# Patient Record
Sex: Female | Born: 1985 | Race: Black or African American | Hispanic: No | Marital: Single | Smoking: Current every day smoker
Health system: Southern US, Community
[De-identification: ages and names within clinical notes are randomized; demographics above are authoritative.]

---

## 2010-03-06 ENCOUNTER — Ambulatory Visit: Payer: Self-pay | Admitting: Internal Medicine

## 2010-03-06 ENCOUNTER — Emergency Department: Payer: Self-pay | Admitting: Emergency Medicine

## 2012-01-14 ENCOUNTER — Emergency Department: Payer: Self-pay | Admitting: Emergency Medicine

## 2012-07-06 ENCOUNTER — Emergency Department: Payer: Self-pay | Admitting: Internal Medicine

## 2012-07-06 LAB — CBC
HCT: 34.6 % — ABNORMAL LOW (ref 35.0–47.0)
MCHC: 32.4 g/dL (ref 32.0–36.0)
MCV: 76 fL — ABNORMAL LOW (ref 80–100)
Platelet: 379 10*3/uL (ref 150–440)
RDW: 17.5 % — ABNORMAL HIGH (ref 11.5–14.5)
WBC: 10.4 10*3/uL (ref 3.6–11.0)

## 2012-07-06 LAB — COMPREHENSIVE METABOLIC PANEL
Albumin: 3.5 g/dL (ref 3.4–5.0)
Anion Gap: 3 — ABNORMAL LOW (ref 7–16)
BUN: 5 mg/dL — ABNORMAL LOW (ref 7–18)
Calcium, Total: 9.3 mg/dL (ref 8.5–10.1)
Co2: 31 mmol/L (ref 21–32)
Creatinine: 0.84 mg/dL (ref 0.60–1.30)
Glucose: 88 mg/dL (ref 65–99)
SGOT(AST): 17 U/L (ref 15–37)
Sodium: 139 mmol/L (ref 136–145)

## 2012-07-06 LAB — URINALYSIS, COMPLETE
Bacteria: NONE SEEN
Bilirubin,UR: NEGATIVE
Blood: NEGATIVE
Nitrite: NEGATIVE
Ph: 6 (ref 4.5–8.0)
Protein: NEGATIVE
RBC,UR: 2 /HPF (ref 0–5)
Specific Gravity: 1.018 (ref 1.003–1.030)
WBC UR: 1 /HPF (ref 0–5)

## 2012-08-04 ENCOUNTER — Emergency Department: Payer: Self-pay | Admitting: Emergency Medicine

## 2012-08-27 ENCOUNTER — Emergency Department: Payer: Self-pay | Admitting: Emergency Medicine

## 2012-11-11 ENCOUNTER — Emergency Department: Payer: Self-pay | Admitting: Emergency Medicine

## 2012-11-11 LAB — URINALYSIS, COMPLETE
Bacteria: NONE SEEN
Bilirubin,UR: NEGATIVE
Glucose,UR: NEGATIVE mg/dL (ref 0–75)
Ketone: NEGATIVE
Leukocyte Esterase: NEGATIVE
Nitrite: NEGATIVE
Ph: 8 (ref 4.5–8.0)
Protein: NEGATIVE
RBC,UR: 1 /HPF (ref 0–5)
Specific Gravity: 1.012 (ref 1.003–1.030)
WBC UR: <1 /HPF (ref 0–5)

## 2012-11-11 LAB — BASIC METABOLIC PANEL
Anion Gap: 4 — ABNORMAL LOW (ref 7–16)
BUN: 6 mg/dL — ABNORMAL LOW (ref 7–18)
Calcium, Total: 9.2 mg/dL (ref 8.5–10.1)
Co2: 30 mmol/L (ref 21–32)
Creatinine: 0.79 mg/dL (ref 0.60–1.30)
EGFR (Non-African Amer.): >60
Potassium: 3.9 mmol/L (ref 3.5–5.1)

## 2012-11-11 LAB — CBC
HGB: 11.1 g/dL — ABNORMAL LOW (ref 12.0–16.0)
MCH: 25.7 pg — ABNORMAL LOW (ref 26.0–34.0)
MCHC: 33.5 g/dL (ref 32.0–36.0)
Platelet: 339 10*3/uL (ref 150–440)
RBC: 4.32 10*6/uL (ref 3.80–5.20)
RDW: 16.6 % — ABNORMAL HIGH (ref 11.5–14.5)
WBC: 10.7 10*3/uL (ref 3.6–11.0)

## 2013-03-08 ENCOUNTER — Emergency Department: Payer: Self-pay | Admitting: Emergency Medicine

## 2013-08-20 ENCOUNTER — Emergency Department: Payer: Self-pay | Admitting: Emergency Medicine

## 2015-05-22 ENCOUNTER — Emergency Department
Admission: EM | Admit: 2015-05-22 | Discharge: 2015-05-22 | Disposition: A | Discharge status: Home / Self Care | Payer: Self-pay | Attending: Emergency Medicine | Admitting: Emergency Medicine

## 2015-05-22 DIAGNOSIS — K529 Noninfective gastroenteritis and colitis, unspecified: Secondary | ICD-10-CM | POA: Insufficient documentation

## 2015-05-22 DIAGNOSIS — R197 Diarrhea, unspecified: Secondary | ICD-10-CM

## 2015-05-22 DIAGNOSIS — R112 Nausea with vomiting, unspecified: Secondary | ICD-10-CM

## 2015-05-22 LAB — COMPREHENSIVE METABOLIC PANEL
ALK PHOS: 74 U/L (ref 38–126)
ALT: 15 U/L (ref 14–54)
ANION GAP: 8 (ref 5–15)
AST: 20 U/L (ref 15–41)
Albumin: 4 g/dL (ref 3.5–5.0)
BILIRUBIN TOTAL: 0.5 mg/dL (ref 0.3–1.2)
BUN: 9 mg/dL (ref 6–20)
CALCIUM: 9.3 mg/dL (ref 8.9–10.3)
CO2: 29 mmol/L (ref 22–32)
CREATININE: 1.06 mg/dL — AB (ref 0.44–1.00)
Chloride: 99 mmol/L — ABNORMAL LOW (ref 101–111)
Glucose, Bld: 114 mg/dL — ABNORMAL HIGH (ref 65–99)
Potassium: 3.4 mmol/L — ABNORMAL LOW (ref 3.5–5.1)
Sodium: 136 mmol/L (ref 135–145)
TOTAL PROTEIN: 7.8 g/dL (ref 6.5–8.1)

## 2015-05-22 LAB — URINALYSIS COMPLETE WITH MICROSCOPIC (ARMC ONLY)
Bacteria, UA: NONE SEEN
Bilirubin Urine: NEGATIVE
GLUCOSE, UA: NEGATIVE mg/dL
Hgb urine dipstick: NEGATIVE
Ketones, ur: NEGATIVE mg/dL
Leukocytes, UA: NEGATIVE
Nitrite: NEGATIVE
Protein, ur: NEGATIVE mg/dL
RBC / HPF: NONE SEEN RBC/hpf (ref 0–5)
SPECIFIC GRAVITY, URINE: 1 — AB (ref 1.005–1.030)
pH: 6 (ref 5.0–8.0)

## 2015-05-22 LAB — CBC
HEMATOCRIT: 36.9 % (ref 35.0–47.0)
HEMOGLOBIN: 12.3 g/dL (ref 12.0–16.0)
MCH: 25.9 pg — AB (ref 26.0–34.0)
MCHC: 33.3 g/dL (ref 32.0–36.0)
MCV: 77.9 fL — AB (ref 80.0–100.0)
Platelets: 388 10*3/uL (ref 150–440)
RBC: 4.73 MIL/uL (ref 3.80–5.20)
RDW: 15.2 % — ABNORMAL HIGH (ref 11.5–14.5)
WBC: 17 10*3/uL — ABNORMAL HIGH (ref 3.6–11.0)

## 2015-05-22 LAB — LIPASE, BLOOD: LIPASE: 17 U/L (ref 11–51)

## 2015-05-22 MED ORDER — LOPERAMIDE HCL 2 MG PO CAPS
4.0000 mg | ORAL_CAPSULE | Freq: Once | ORAL | Status: AC
  Administered 2015-05-22: 4 mg via ORAL

## 2015-05-22 MED ORDER — SODIUM CHLORIDE 0.9 % IV BOLUS (SEPSIS)
1000.0000 mL | Freq: Once | INTRAVENOUS | Status: AC
  Administered 2015-05-22: 1000 mL via INTRAVENOUS

## 2015-05-22 MED ORDER — ONDANSETRON 4 MG PO TBDP: 4.0000 mg | ORAL_TABLET | Freq: Three times a day (TID) | ORAL | Status: DC | PRN

## 2015-05-22 MED ORDER — ONDANSETRON HCL 4 MG/2ML IJ SOLN
INTRAMUSCULAR | Status: AC
  Administered 2015-05-22: 4 mg via INTRAVENOUS
  Filled 2015-05-22: qty 2

## 2015-05-22 MED ORDER — ONDANSETRON HCL 4 MG/2ML IJ SOLN
4.0000 mg | Freq: Once | INTRAMUSCULAR | Status: AC
  Administered 2015-05-22: 4 mg via INTRAVENOUS

## 2015-05-22 MED ORDER — LOPERAMIDE HCL 2 MG PO CAPS
ORAL_CAPSULE | ORAL | Status: AC
  Administered 2015-05-22: 4 mg via ORAL
  Filled 2015-05-22: qty 2

## 2015-05-22 NOTE — ED Provider Notes (Signed)
[facility] Emergency Department Provider Note  Time seen: 9:39 PM  I have reviewed the triage vital signs and the nursing notes.   HISTORY  Chief Complaint Emesis    HPI Lauren Dean is a 30 y.o. female with no past medical history who presents the emergency department nausea, vomiting, diarrhea beginning yesterday. According to the patient for the past 24 hours or so she has been nauseated with intermittent vomiting and also having a moderate amount of loose stool. Denies any abdominal pain. Denies any chest pain. Denies any dysuria. Denies any known sick contacts. Patient states she feels dehydrated which is why she came to the emergency department.     No past medical history on file.  There are no active problems to display for this patient.   No past surgical history on file.  No current outpatient prescriptions on file.  Allergies Review of patient's allergies indicates no known allergies.  No family history on file.  Social History Social History  Substance Use Topics  . Smoking status: Not on file  . Smokeless tobacco: Not on file  . Alcohol Use: Not on file    Review of Systems Constitutional: Negative for fever. Cardiovascular: Negative for chest pain. Respiratory: Negative for shortness of breath. Gastrointestinal: Negative for abdominal pain. Positive for nausea, vomiting, diarrhea. Genitourinary: Negative for dysuria. Musculoskeletal: Negative for back pain Neurological: Negative for headache 10-point ROS otherwise negative.  ____________________________________________   PHYSICAL EXAM:  VITAL SIGNS: ED Triage Vitals  Enc Vitals Group     BP 05/22/15 2038 145/79 mmHg     Pulse Rate 05/22/15 2039 82     Resp 05/22/15 2039 18     Temp 05/22/15 2039 98.2 F (36.8 C)     Temp Source 05/22/15 2034 Oral     SpO2 05/22/15 2039 95 %     Weight 05/22/15 2034 255 lb (115.667 kg)     Height 05/22/15 2034  (1.702 m)     Head Cir --      Peak Flow --      Pain Score 05/22/15 2129 0     Pain Loc --      Pain Edu? --      Excl. in GC? --     Constitutional: Alert and oriented. Well appearing and in no distress. Eyes: Normal exam ENT   Head: Normocephalic and atraumatic.   Mouth/Throat: Mucous membranes are moist. Cardiovascular: Normal rate, regular rhythm. No murmur Respiratory: Normal respiratory effort without tachypnea nor retractions. Breath sounds are clear  Gastrointestinal: Soft and nontender. No distention.  Musculoskeletal: Nontender with normal range of motion in all extremities. Neurologic:  Normal speech and language. No gross focal neurologic deficits Skin:  Skin is warm, dry and intact.  Psychiatric: Mood and affect are normal.   ____________________________________________   INITIAL IMPRESSION / ASSESSMENT AND PLAN / ED COURSE  Pertinent labs & imaging results that were available during my care of the patient were reviewed by me and considered in my medical decision making (see chart for details).  Well-appearing patient, no distress. Patient states nausea, vomiting, diarrhea since last night. Denies any abdominal pain. Nontender exam. We'll dose IV Zofran, IV fluids, loperamide, and closely monitor in the emergency department.  Currently awaiting lab results.  Labs show a moderate leukocytosis otherwise within normal limits. Nontender abdomen. Likely gastroenteritis. We'll discharge Zofran. Patient agreeable plan and is feeling much better.   ____________________________________________   FINAL CLINICAL IMPRESSION(S) / ED DIAGNOSES  Gastroenteritis   Minna AntisKevin Ashad Fawbush, MD 05/22/15 2223

## 2015-05-22 NOTE — Discharge Instructions (Signed)

## 2015-05-22 NOTE — ED Notes (Signed)
Pt in with co n.v.d since yesterday denies any abd pain or dysuria.

## 2015-05-22 NOTE — ED Notes (Signed)
Accidentally clicked off POC and sent to lab before performing test

## 2016-02-13 ENCOUNTER — Emergency Department: Payer: Self-pay

## 2016-02-13 ENCOUNTER — Encounter: Payer: Self-pay | Admitting: Emergency Medicine

## 2016-02-13 ENCOUNTER — Emergency Department
Admission: EM | Admit: 2016-02-13 | Discharge: 2016-02-13 | Disposition: A | Discharge status: Home / Self Care | Payer: Self-pay | Attending: Emergency Medicine | Admitting: Emergency Medicine

## 2016-02-13 DIAGNOSIS — W010XXA Fall on same level from slipping, tripping and stumbling without subsequent striking against object, initial encounter: Secondary | ICD-10-CM | POA: Insufficient documentation

## 2016-02-13 DIAGNOSIS — Y939 Activity, unspecified: Secondary | ICD-10-CM | POA: Insufficient documentation

## 2016-02-13 DIAGNOSIS — F17200 Nicotine dependence, unspecified, uncomplicated: Secondary | ICD-10-CM | POA: Insufficient documentation

## 2016-02-13 DIAGNOSIS — Y929 Unspecified place or not applicable: Secondary | ICD-10-CM | POA: Insufficient documentation

## 2016-02-13 DIAGNOSIS — Y999 Unspecified external cause status: Secondary | ICD-10-CM | POA: Insufficient documentation

## 2016-02-13 DIAGNOSIS — S8265XA Nondisplaced fracture of lateral malleolus of left fibula, initial encounter for closed fracture: Secondary | ICD-10-CM | POA: Insufficient documentation

## 2016-02-13 MED ORDER — HYDROCODONE-ACETAMINOPHEN 5-325 MG PO TABS
1.0000 | ORAL_TABLET | Freq: Once | ORAL | Status: AC
  Administered 2016-02-13: 1 via ORAL
  Filled 2016-02-13: qty 1

## 2016-02-13 MED ORDER — OXYCODONE-ACETAMINOPHEN 5-325 MG PO TABS: 1.0000 | ORAL_TABLET | ORAL | 0 refills | Status: DC | PRN

## 2016-02-13 NOTE — ED Provider Notes (Signed)
[facility] Emergency Department Provider Note   ____________________________________________   First MD Initiated Contact with Patient 02/13/16 2107     (approximate)  I have reviewed the triage vital signs and the nursing notes.   HISTORY  Chief Complaint Foot Pain    HPI Lauren Dean is a 31 y.o. female who presents for evaluation of right foot pain after slipping and falling last night. She states that she twisted her ankle on the snow. Complaining of 10 over 10 pain at this time.   History reviewed. No pertinent past medical history.  There are no active problems to display for this patient.   History reviewed. No pertinent surgical history.  Prior to Admission medications   Medication Sig Start Date End Date Taking? Authorizing Provider  ondansetron (ZOFRAN ODT) 4 MG disintegrating tablet Take 1 tablet (4 mg total) by mouth every 8 (eight) hours as needed for nausea or vomiting. 05/22/15   Minna AntisKevin Paduchowski, MD  oxyCODONE-acetaminophen (ROXICET) 5-325 MG tablet Take 1-2 tablets by mouth every 4 (four) hours as needed for severe pain. 02/13/16   Evangeline Dakinharles M Quita Mcgrory, PA-C    Allergies Patient has no known allergies.  History reviewed. No pertinent family history.  Social History Social History  Substance Use Topics  . Smoking status: Current Every Day Smoker  . Smokeless tobacco: Never Used  . Alcohol use No    Review of Systems Constitutional: No fever/chills Eyes: No visual changes. ENT: No sore throat. Cardiovascular: Denies chest pain. Respiratory: Denies shortness of breath. Gastrointestinal: No abdominal pain.  No nausea, no vomiting.  No diarrhea.  No constipation. Genitourinary: Negative for dysuria. Musculoskeletal: Positive for lateral right foot\ankle pain. Skin: Negative for rash. Neurological: Negative for headaches, focal weakness or numbness.  10-point ROS otherwise  negative.  ____________________________________________   PHYSICAL EXAM: BP (!) 131/58 (BP Location: Right Arm)   Pulse 72   Temp 98.1 F (36.7 C) (Oral)   Resp 18   Ht 5\' 7"  (1.702 m)   Wt 111.1 kg   LMP 01/20/2016   SpO2 98%   BMI 38.37 kg/m   VITAL SIGNS: ED Triage Vitals  Enc Vitals Group     BP      Pulse      Resp      Temp      Temp src      SpO2      Weight      Height      Head Circumference      Peak Flow      Pain Score      Pain Loc      Pain Edu?      Excl. in GC?     Constitutional: Alert and oriented. Well appearing and in no acute distress.   Cardiovascular: Normal rate, regular rhythm. Grossly normal heart sounds.  Good peripheral circulation. Respiratory: Normal respiratory effort.  No retractions. Lungs CTAB. Musculoskeletal: 1+ edema right lower ankle. Neurologic:  Normal speech and language. No gross focal neurologic deficits are appreciated. Slightly neurovascularly intact. Skin:  Skin is warm, dry and intact. No rash noted. Psychiatric: Mood and affect are normal. Speech and behavior are normal.  ____________________________________________   LABS (all labs ordered are listed, but only abnormal results are displayed)  Labs Reviewed - No data to display ____________________________________________  EKG   ____________________________________________  RADIOLOGY  Distal fibular oblique fracture. ____________________________________________   PROCEDURES  Procedure(s) performed: None  Procedures  Critical Care performed: No  ____________________________________________   INITIAL IMPRESSION / ASSESSMENT AND PLAN / ED COURSE  Pertinent labs & imaging results that were available during my care of the patient were reviewed by me and considered in my medical decision making (see chart for details).  Acute closed oblique fracture of the distal fibula. Rx Percocet 5/325 crutches posterior splint applied. Patient follow-up with  orthopedics next week. She voices no other emergency medical complaints at this time.      ____________________________________________   FINAL CLINICAL IMPRESSION(S) / ED DIAGNOSES  Final diagnoses:  Closed nondisplaced fracture of lateral malleolus of left fibula, initial encounter      NEW MEDICATIONS STARTED DURING THIS VISIT:  New Prescriptions   OXYCODONE-ACETAMINOPHEN (ROXICET) 5-325 MG TABLET    Take 1-2 tablets by mouth every 4 (four) hours as needed for severe pain.     Note:  This document was prepared using Dragon voice recognition software and may include unintentional dictation errors.   Evangeline Dakin, PA-C 02/13/16 2205    Myrna Blazer, MD 02/14/16 [phone]

## 2016-02-13 NOTE — ED Triage Notes (Signed)
Pt states she slipped yesterday injuring right foot and anterior ankle. Ankle is swollen, cms intact to foot.

## 2016-02-13 NOTE — ED Notes (Signed)
Patient to stat registration in wheelchair.  Reports right foot pain after slip and fall last night.

## 2017-09-16 IMAGING — DX DG FOOT COMPLETE 3+V*R*
3 series · 3 of 3 positions shown · non-contrast
Comparison: None.

CLINICAL DATA: Pain after injury

EXAM:
RIGHT FOOT COMPLETE - 3+ VIEW

[foot ap]
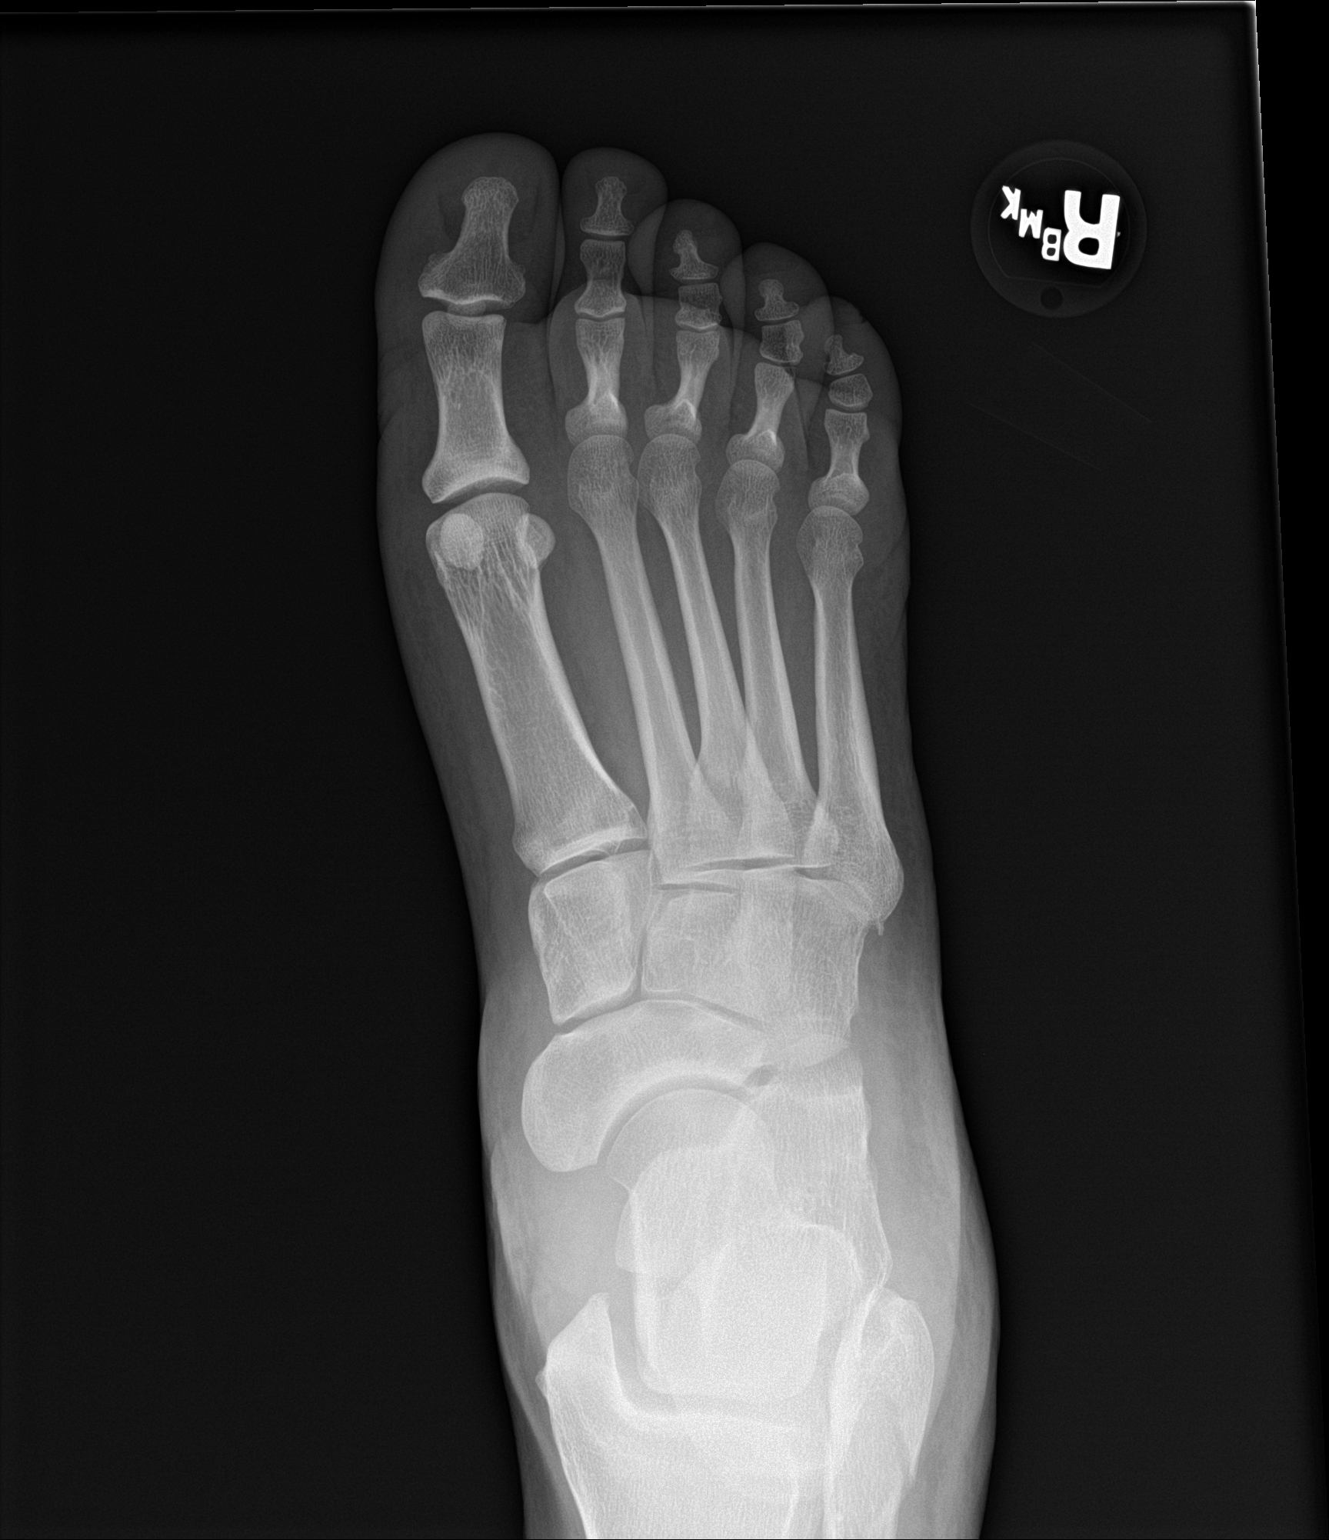

[foot obl]
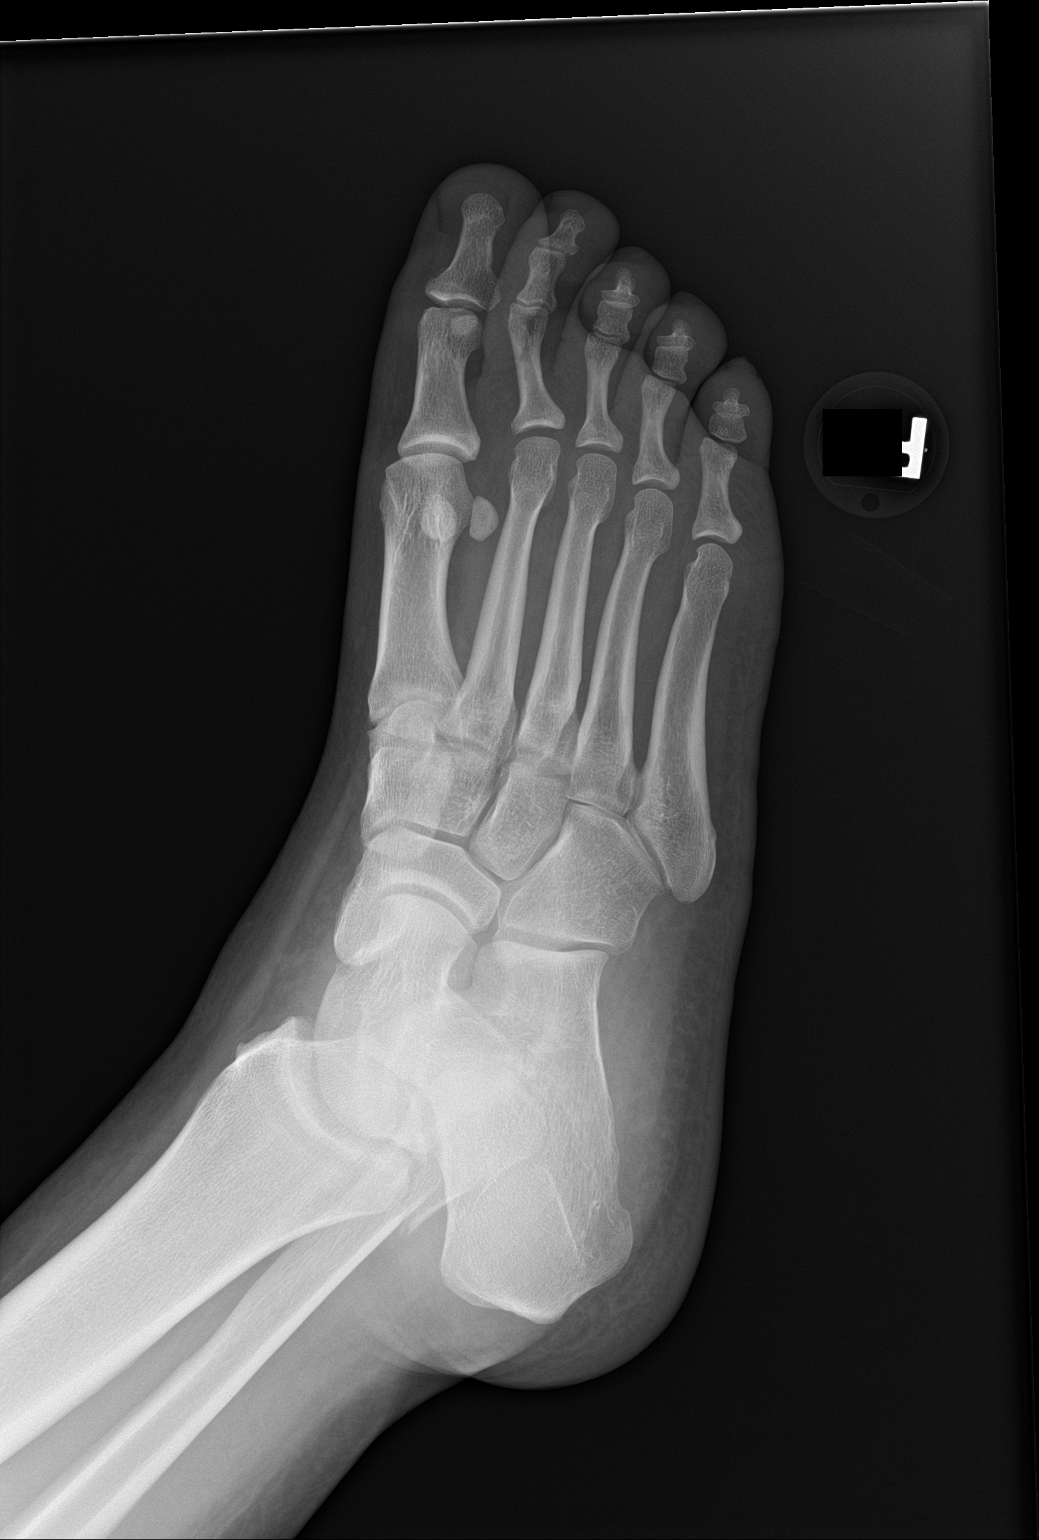

[foot lat]
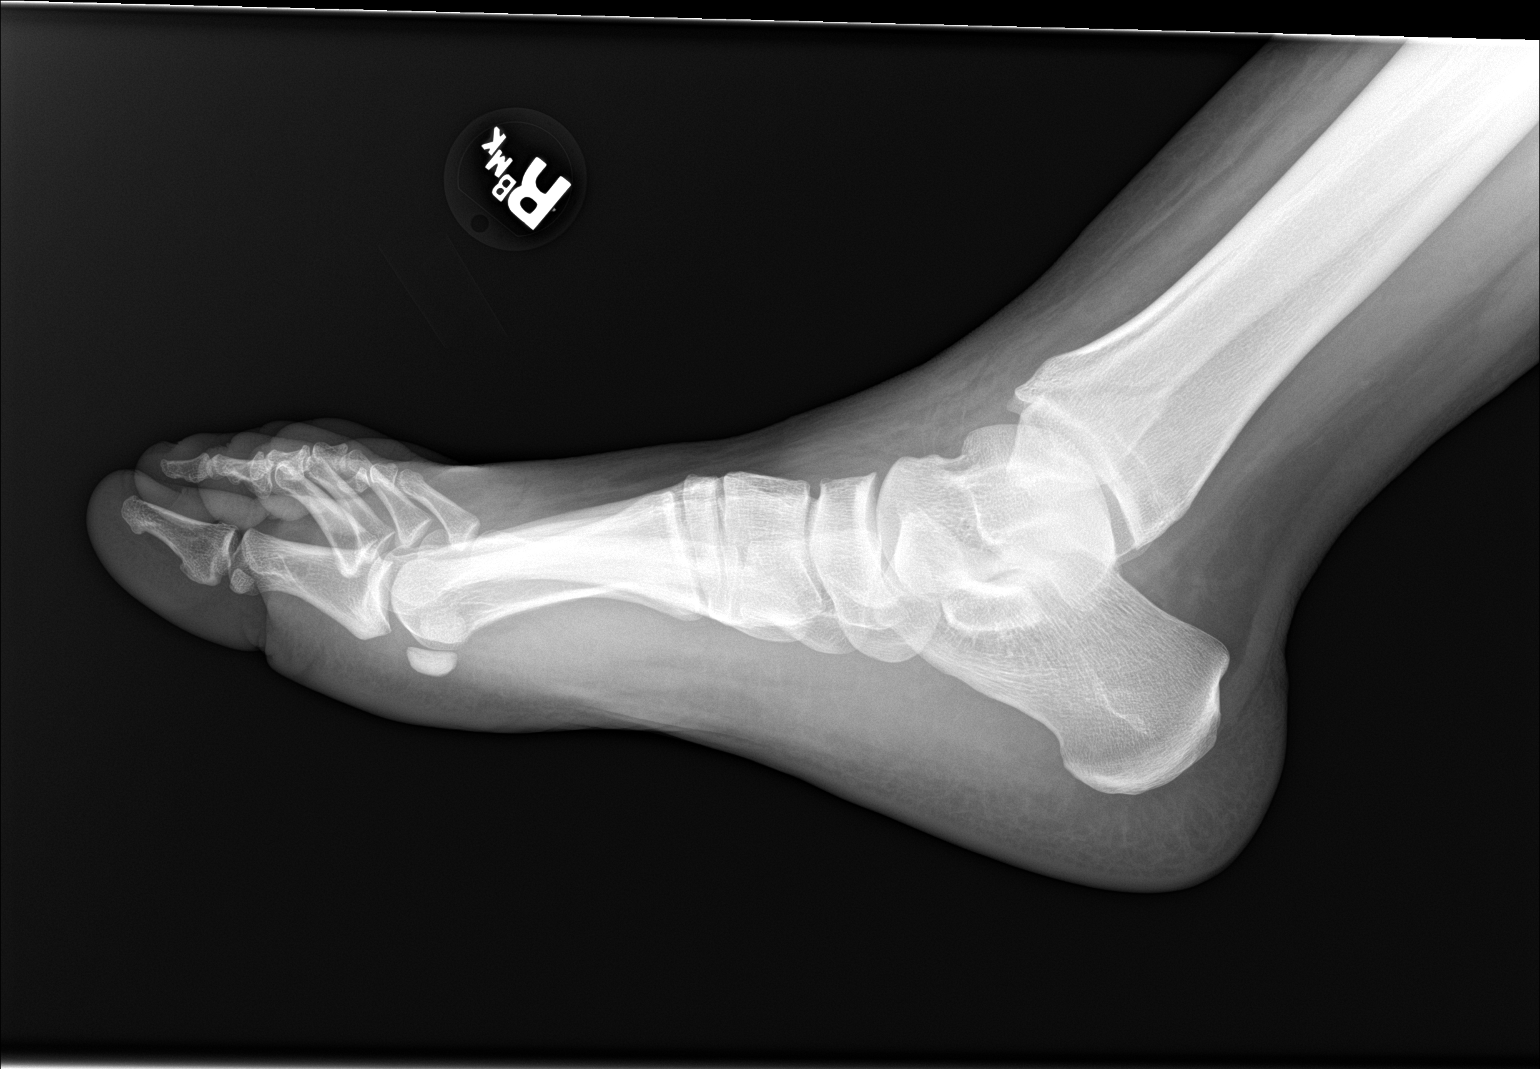

[3 of 3 positions shown; findings below may reference images not displayed]

FINDINGS: There is an oblique nondisplaced intra-articular fracture of the
distal fibula extending into the ankle joint. There is overlying
soft tissue swelling.

There is a tiny 2 x 1 mm ossification adjacent to the fifth
metatarsal base potentially representing a tiny avulsion though a
donor site is not identified. This would be along the course of the
peroneal tendons. This is not confirmed however on the oblique nor
lateral views. Correlate for pain over the base of the fifth
metatarsal and if correlated, this may be a radiographically
significant finding. Joint spaces are maintained.

Another finding is a linear zone of sclerosis involving the
posterior calcaneus potentially representing mild compression injury
of the posterior calcaneus versus a tubercle or tiny bony exostosis
off the calcaneus. This too can be correlated.
IMPRESSION: 1. Acute closed oblique fracture of the distal fibula extending into
the ankle joint.
2. Equivocal avulsion injury off the base of the metatarsal seen
only on one view with tiny ossific 2 mm density seen on the PA
projection only. Differential possibility might include tendinopathy
of a peroneal tendon.
3. Linear zone of sclerosis involving the posterior calcaneus
potentially representing a mild compression fracture of the
calcaneus

## 2018-02-22 ENCOUNTER — Emergency Department
Admission: EM | Admit: 2018-02-22 | Discharge: 2018-02-22 | Disposition: A | Discharge status: Home / Self Care | Payer: Self-pay | Attending: Emergency Medicine | Admitting: Emergency Medicine

## 2018-02-22 ENCOUNTER — Other Ambulatory Visit: Payer: Self-pay

## 2018-02-22 ENCOUNTER — Encounter: Payer: Self-pay | Admitting: Emergency Medicine

## 2018-02-22 DIAGNOSIS — F17200 Nicotine dependence, unspecified, uncomplicated: Secondary | ICD-10-CM | POA: Insufficient documentation

## 2018-02-22 DIAGNOSIS — K047 Periapical abscess without sinus: Secondary | ICD-10-CM | POA: Insufficient documentation

## 2018-02-22 MED ORDER — AMOXICILLIN 500 MG PO CAPS: 500.0000 mg | ORAL_CAPSULE | Freq: Three times a day (TID) | ORAL | 0 refills | Status: DC

## 2018-02-22 MED ORDER — LIDOCAINE VISCOUS HCL 2 % MT SOLN
15.0000 mL | Freq: Once | OROMUCOSAL | Status: AC
  Administered 2018-02-22: 15 mL via OROMUCOSAL
  Filled 2018-02-22: qty 15

## 2018-02-22 MED ORDER — LIDOCAINE VISCOUS HCL 2 % MT SOLN: 10.0000 mL | OROMUCOSAL | 0 refills | Status: DC | PRN

## 2018-02-22 MED ORDER — IBUPROFEN 600 MG PO TABS
600.0000 mg | ORAL_TABLET | Freq: Once | ORAL | Status: AC
  Administered 2018-02-22: 600 mg via ORAL
  Filled 2018-02-22: qty 1

## 2018-02-22 NOTE — ED Provider Notes (Signed)
[facility] Emergency Department Provider Note  ____________________________________________  Time seen: Approximately 8:40 AM  I have reviewed the triage vital signs and the nursing notes.   HISTORY  Chief Complaint Facial Swelling and Dental Pain    HPI Lauren Dean is a 33 y.o. female presents to emergency department for evaluation of right-sided cheek swelling and pain for 2 days.  Patient states that she has a tooth broken off.  She does not have a dentist. No fever.  History reviewed. No pertinent past medical history.  There are no active problems to display for this patient.   History reviewed. No pertinent surgical history.  Prior to Admission medications   Medication Sig Start Date End Date Taking? Authorizing Provider  amoxicillin (AMOXIL) 500 MG capsule Take 1 capsule (500 mg total) by mouth 3 (three) times daily. 02/22/18   Enid Derry, PA-C  lidocaine (XYLOCAINE) 2 % solution Use as directed 10 mLs in the mouth or throat as needed for mouth pain. 02/22/18   Enid Derry, PA-C  ondansetron (ZOFRAN ODT) 4 MG disintegrating tablet Take 1 tablet (4 mg total) by mouth every 8 (eight) hours as needed for nausea or vomiting. 05/22/15   Minna Antis, MD  oxyCODONE-acetaminophen (ROXICET) 5-325 MG tablet Take 1-2 tablets by mouth every 4 (four) hours as needed for severe pain. 02/13/16   Beers, Charmayne Sheer, PA-C    Allergies Patient has no known allergies.  No family history on file.  Social History Social History   Tobacco Use  . Smoking status: Current Every Day Smoker  . Smokeless tobacco: Never Used  Substance Use Topics  . Alcohol use: No  . Drug use: Yes    Types: Marijuana     Review of Systems  Constitutional: No fever/chills ENT: No upper respiratory complaints. Cardiovascular: No chest pain. Respiratory: No cough. No SOB. Gastrointestinal: No abdominal pain.  No nausea, no vomiting.  Musculoskeletal: Negative for  musculoskeletal pain. Skin: Negative for rash, abrasions, lacerations, ecchymosis. Neurological: Negative for headaches, numbness or tingling   ____________________________________________   PHYSICAL EXAM:  VITAL SIGNS: ED Triage Vitals  Enc Vitals Group     BP 02/22/18 0811 135/68     Pulse Rate 02/22/18 0811 77     Resp 02/22/18 0811 16     Temp 02/22/18 0811 98.4 F (36.9 C)     Temp Source 02/22/18 0811 Oral     SpO2 02/22/18 0811 98 %     Weight 02/22/18 0812 265 lb (120.2 kg)     Height 02/22/18 0812 5\' 7"  (1.702 m)     Head Circumference --      Peak Flow --      Pain Score 02/22/18 0811 9     Pain Loc --      Pain Edu? --      Excl. in GC? --      Constitutional: Alert and oriented. Well appearing and in no acute distress. Eyes: Conjunctivae are normal. PERRL. EOMI. Head: Atraumatic. ENT:      Ears:      Nose: No congestion/rhinnorhea.      Mouth/Throat: Mucous membranes are moist.  Large cavity in tooth #13.  Mild swelling to right cheek.  Full range of motion of jaw. Neck: No stridor. Cardiovascular: Normal rate, regular rhythm.  Good peripheral circulation. Respiratory: Normal respiratory effort without tachypnea or retractions. Lungs CTAB. Good air entry to the bases with no decreased or absent breath sounds. Musculoskeletal: Full range of motion  to all extremities. No gross deformities appreciated. Neurologic:  Normal speech and language. No gross focal neurologic deficits are appreciated.  Skin:  Skin is warm, dry and intact. No rash noted. Psychiatric: Mood and affect are normal. Speech and behavior are normal. Patient exhibits appropriate insight and judgement.   ____________________________________________   LABS (all labs ordered are listed, but only abnormal results are displayed)  Labs Reviewed - No data to display ____________________________________________  EKG   ____________________________________________  RADIOLOGY   No results  found.  ____________________________________________    PROCEDURES  Procedure(s) performed:    Procedures    Medications  lidocaine (XYLOCAINE) 2 % viscous mouth solution 15 mL (15 mLs Mouth/Throat Given 02/22/18 0931)  ibuprofen (ADVIL,MOTRIN) tablet 600 mg (600 mg Oral Given 02/22/18 0931)     ____________________________________________   INITIAL IMPRESSION / ASSESSMENT AND PLAN / ED COURSE  Pertinent labs & imaging results that were available during my care of the patient were reviewed by me and considered in my medical decision making (see chart for details).  Review of the [place] CSRS was performed in accordance of the [facility] prior to dispensing any controlled drugs.     Patient's diagnosis is consistent with dental abscess.  Vital signs and exam are reassuring.  Patient will be discharged home with prescriptions for amoxicillin and viscous lidocaine. Patient is to follow up with dentist as directed.  Dental resources were given.  Patient is given ED precautions to return to the ED for any worsening or new symptoms.     ____________________________________________  FINAL CLINICAL IMPRESSION(S) / ED DIAGNOSES  Final diagnoses:  Dental abscess      NEW MEDICATIONS STARTED DURING THIS VISIT:  ED Discharge Orders         Ordered    amoxicillin (AMOXIL) 500 MG capsule  3 times daily     02/22/18 0919    lidocaine (XYLOCAINE) 2 % solution  As needed     02/22/18 0919              This chart was dictated using voice recognition software/Dragon. Despite best efforts to proofread, errors can occur which can change the meaning. Any change was purely unintentional.    Enid DerryWagner, Kendi Defalco, PA-C 02/22/18 1142    Emily FilbertWilliams, Jonathan E, MD 02/22/18 [phone]

## 2018-02-22 NOTE — ED Notes (Signed)
Top right molar # 2 from front broken off at gumline. Right facial swelling. Pain 9/10

## 2018-02-22 NOTE — Discharge Instructions (Signed)
OPTIONS FOR DENTAL FOLLOW UP CARE ° °[place] Department of Health and Human Services - Local Safety Net Dental Clinics °http://www.ncdhhs.gov/dph/oralhealth/services/safetynetclinics.htm °  °[facility] (336-562-3123) ° °[facility] [place] ([phone]) ° °[facility] ([phone] ext [phone]) ° °[facility] ([phone]) ° °[facility] ([phone]) °This clinic caters to the indigent population and is on a lottery system. °Location: °[facility], [address], [place] °Clinic Hours: °Wednesdays from 6pm - 9pm, patients seen by a lottery system. °For dates, call or go to [email or web address]/[facility]/[email or web address] °Services: °Cleanings, fillings and simple extractions. °Payment Options: °DENTAL WORK IS FREE OF CHARGE. Bring proof of income or support. °Best way to get seen: °Arrive at 5:15 pm - this is a lottery, NOT first come/first serve, so arriving earlier will not increase your chances of being seen. °  °  °[facility] °[phone] °Select option 1 for emergencies °  °Location: °[facility], [address], [place] °Clinic Hours: °No walk-ins accepted - call the day before to schedule an appointment. °Check in times are 9:30 am and 1:30 pm. °Services: °Simple extractions, temporary fillings, pulpectomy/pulp debridement, uncomplicated abscess drainage. °Payment Options: °PAYMENT IS DUE AT THE TIME OF SERVICE.  Fee is usually $100-200, additional surgical procedures (e.g. abscess drainage) may be extra. °Cash, checks, Visa/MasterCard accepted.  Can file Medicaid if patient is covered for dental - patient should call case worker to check. °No discount for [facility] patients. °Best way to get seen: °MUST call the day before and get onto the schedule. Can usually be seen the next 1-2 days. No walk-ins accepted. °  °  °[facility] °[phone] °   °Location: °[facility], [address], [place] °Clinic Hours: °M, W, Th, F 8am or 1:30pm, Tues 9a or 1:30 - first come/first served. °Services: °Simple extractions, temporary fillings, uncomplicated abscess drainage.  You do not need to be an [place] resident. °Payment Options: °PAYMENT IS DUE AT THE TIME OF SERVICE. °Dental insurance, otherwise sliding scale - bring proof of income or support. °Depending on income and treatment needed, cost is usually $50-200. °Best way to get seen: °Arrive early as it is first come/first served. °  °  °[facility] °[phone] °  °Location: °[address] °Clinic Hours: °Mon-Thu 8a-5p °Services: °Most basic dental services including extractions and fillings. °Payment Options: °PAYMENT IS DUE AT THE TIME OF SERVICE. °Sliding scale, up to 50% off - bring proof if income or support. °Medicaid with dental option accepted. °Best way to get seen: °Call to schedule an appointment, can usually be seen within 2 weeks OR they will try to see walk-ins - show up at 8a or 2p (you may have to wait). °  °  °[facility] °[phone] °[place] RESIDENTS ONLY °  °Location: °[facility], [address], [place], [place] [address] °Clinic Hours: By appointment only. °Monday - Thursday 8am-5pm, Friday 8am-12pm °Services: Cleanings, fillings, extractions. °Payment Options: °PAYMENT IS DUE AT THE TIME OF SERVICE. °Cash, Visa or MasterCard. Sliding scale - $30 minimum per service. °Best way to get seen: °Come in to office, complete packet and make an appointment - need proof of income °or support monies for each household member and proof of [place] residence. °Usually takes about a month to get in. °  °  °[facility] °[phone] °  °Location: °[address]   [place] °Clinic Hours: Walk-in Urgent Care Dental Services are offered Monday-Friday  mornings only. °The numbers of emergencies accepted daily is limited to the number of °providers available. °Maximum 15 - Mondays, Wednesdays & Thursdays °Maximum 10 - Tuesdays & Fridays °Services: °You do not need to be a [place] resident to be seen for a dental emergency. °Emergencies are defined as pain, swelling, abnormal bleeding, or dental trauma. Walkins will receive x-rays if needed. °NOTE: Dental cleaning is not an emergency. °Payment Options: °PAYMENT IS DUE AT THE TIME OF SERVICE. °Minimum co-pay is $40.00 for uninsured patients. °Minimum co-pay is $3.00 for Medicaid with dental coverage. °Dental Insurance is accepted and must be presented at time of visit. °Medicare does not cover dental. °Forms of payment: Cash, credit card, checks. °Best way to get seen: °If not previously registered with the clinic, walk-in dental registration begins at 7:15 am and is on a first come/first serve basis. °If previously registered with the clinic, call to make an appointment. °  °  °[facility] °[phone] °[place] RESIDENTS ONLY °  °Location: °[address], [place],  °Clinic Hours: °Mon-Thu 10a-2p °Services: Extractions only! °Payment Options: °FREE (donations accepted) - bring proof of income or support °Best way to get seen: °Call and schedule an appointment OR come at 8am on the 1st Monday of every month (except for holidays) when it is first come/first served. °  °  °[facility] °[phone] °  °Location: °[address], [place] °Clinic Hours: °Friday mornings °Services, Payment Options, Best way to get seen: °Call for info °

## 2018-02-22 NOTE — ED Triage Notes (Signed)
Patient complaining of pain right upper jaw, states she has broken tooth and woke up this AM with swelling on right side of face.

## 2019-06-04 ENCOUNTER — Encounter: Payer: Self-pay | Admitting: Emergency Medicine

## 2019-06-04 ENCOUNTER — Other Ambulatory Visit: Payer: Self-pay

## 2019-06-04 ENCOUNTER — Emergency Department
Admission: EM | Admit: 2019-06-04 | Discharge: 2019-06-04 | Disposition: A | Discharge status: Home / Self Care | Payer: Self-pay | Attending: Emergency Medicine | Admitting: Emergency Medicine

## 2019-06-04 ENCOUNTER — Emergency Department: Payer: Self-pay

## 2019-06-04 DIAGNOSIS — K5900 Constipation, unspecified: Secondary | ICD-10-CM

## 2019-06-04 LAB — POCT PREGNANCY, URINE: Preg Test, Ur: NEGATIVE

## 2019-06-04 MED ORDER — POLYETHYLENE GLYCOL 3350 17 G PO PACK: 17.0000 g | PACK | Freq: Every day | ORAL | 0 refills | Status: AC

## 2019-06-04 NOTE — ED Triage Notes (Signed)
Pt reports no bowel movement for the past week and abd pain for the last few days. Pt reports tried ex lax last pm with no relief.

## 2019-06-04 NOTE — ED Notes (Signed)
See triage note  Presents with abd pain and constipation  States her last BM was 1 week ago  States she tried some Ex-lax w/o relief  Denies any n/v

## 2019-06-04 NOTE — ED Provider Notes (Signed)
[facility] Emergency Department Provider Note ____________________________________________   First MD Initiated Contact with Patient 06/04/19 1012     (approximate)  I have reviewed the triage vital signs and the nursing notes.   HISTORY  Chief Complaint Constipation and Abdominal Pain  HPI Lauren Dean is a 34 y.o. female who presents emergency department for treatment and evaluation of abdominal pain and constipation.  Last bowel movement was approximately 1 week ago.  Abdominal pain started two or 3 days ago.  No relief with Ex-Lax.  She denies nausea, vomiting, or fever.         History reviewed. No pertinent past medical history.  There are no problems to display for this patient.   History reviewed. No pertinent surgical history.  Prior to Admission medications   Medication Sig Start Date End Date Taking? Authorizing Provider  polyethylene glycol (MIRALAX) 17 g packet Take 17 g by mouth daily. 06/04/19   Chinita Pester, FNP    Allergies Patient has no known allergies.  No family history on file.  Social History Social History   Tobacco Use  . Smoking status: Current Every Day Smoker  . Smokeless tobacco: Never Used  Substance Use Topics  . Alcohol use: No  . Drug use: Yes    Types: Marijuana    Review of Systems  Constitutional: No fever/chills Eyes: No visual changes. ENT: No sore throat. Cardiovascular: Denies chest pain. Respiratory: Denies shortness of breath. Gastrointestinal: No abdominal pain.  No nausea, no vomiting.  No diarrhea.  Positive for constipation. Genitourinary: Negative for dysuria. Musculoskeletal: Negative for back pain. Skin: Negative for rash. Neurological: Negative for headaches, focal weakness or numbness. ____________________________________________   PHYSICAL EXAM:  VITAL SIGNS: ED Triage Vitals  Enc Vitals Group     BP 06/04/19 0929 137/76     Pulse Rate 06/04/19 0929 97     Resp  06/04/19 0929 17     Temp 06/04/19 0929 98.8 F (37.1 C)     Temp Source 06/04/19 0929 Oral     SpO2 06/04/19 0929 99 %     Weight 06/04/19 0929 265 lb (120.2 kg)     Height 06/04/19 0929 5\' 7"  (1.702 m)     Head Circumference --      Peak Flow --      Pain Score 06/04/19 0932 8     Pain Loc --      Pain Edu? --      Excl. in GC? --     Constitutional: Alert and oriented. Well appearing and in no acute distress. Eyes: Conjunctivae are normal. PERRL. EOMI. Head: Atraumatic. Nose: No congestion/rhinnorhea. Mouth/Throat: Mucous membranes are moist.  Oropharynx non-erythematous. Neck: No stridor.   Hematological/Lymphatic/Immunilogical: No cervical lymphadenopathy. Cardiovascular: Normal rate, regular rhythm. Grossly normal heart sounds.  Good peripheral circulation. Respiratory: Normal respiratory effort.  No retractions. Lungs CTAB. Gastrointestinal: Soft and nontender. No distention. No abdominal bruits. No CVA tenderness. Genitourinary:  Musculoskeletal: No lower extremity tenderness nor edema.  No joint effusions. Neurologic:  Normal speech and language. No gross focal neurologic deficits are appreciated. No gait instability. Skin:  Skin is warm, dry and intact. No rash noted. Psychiatric: Mood and affect are normal. Speech and behavior are normal.  ____________________________________________   LABS (all labs ordered are listed, but only abnormal results are displayed)  Labs Reviewed  POC URINE PREG, ED  POCT PREGNANCY, URINE   ____________________________________________  EKG  Not indicated ____________________________________________  RADIOLOGY  ED  MD interpretation:    No evidence of small bowel obstruction on plain image of the abdomen.  Moderate stool burden is noted within the left colon.  I, Sherrie George, personally viewed and evaluated these images (plain radiographs) as part of my medical decision making, as well as reviewing the written report by the  radiologist.  Official radiology report(s): DG Abdomen 1 View  Result Date: 06/04/2019 CLINICAL DATA:  Constipation. Additional history provided: Patient reports no bowel movement for the past week and abdominal pain for the last few days. Pain with urination. EXAM: ABDOMEN - 1 VIEW COMPARISON:  Abdominal radiograph 02/20/2013 FINDINGS: No dilated loops of bowel are demonstrated to suggest small bowel obstruction. Moderate stool burden within the left colon. No definite urinary tract calculus is identified. No acute bony abnormality identified. IMPRESSION: No radiographic evidence of small-bowel obstruction. Moderate stool burden within the left colon. Electronically Signed   By: Kellie Simmering DO   On: 06/04/2019 11:28    ____________________________________________   PROCEDURES  Procedure(s) performed (including Critical Care):  Procedures  ____________________________________________   INITIAL IMPRESSION / ASSESSMENT AND PLAN     34 year old female presenting to the emergency department for treatment and evaluation abdominal pain and constipation.  See HPI for further details.  Plan will be to get x-ray of the abdomen after obtaining POC urine pregnancy.  DIFFERENTIAL DIAGNOSIS  Small bowel obstruction, ileus, constipation  ED COURSE  While here, patient was given soapsuds enema.  She had moderate results and relief of abdominal pain.  She was encouraged to take MiraLAX daily and follow-up with her primary care provider. ____________________________________________   FINAL CLINICAL IMPRESSION(S) / ED DIAGNOSES  Final diagnoses:  Constipation, unspecified constipation type     ED Discharge Orders         Ordered    polyethylene glycol (MIRALAX) 17 g packet  Daily     06/04/19 1213           [place] was evaluated in Emergency Department on 06/04/2019 for the symptoms described in the history of present illness. She was evaluated in the context of the global  COVID-19 pandemic, which necessitated consideration that the patient might be at risk for infection with the SARS-CoV-2 virus that causes COVID-19. Institutional protocols and algorithms that pertain to the evaluation of patients at risk for COVID-19 are in a state of rapid change based on information released by regulatory bodies including the CDC and federal and state organizations. These policies and algorithms were followed during the patient's care in the ED.   Note:  This document was prepared using Dragon voice recognition software and may include unintentional dictation errors.   Victorino Dike, FNP 06/04/19 1626    Arta Silence, MD 06/04/19 2013

## 2019-06-04 NOTE — Discharge Instructions (Signed)
Take MiraLax daily. Follow up with primary care for symptoms that are not improving over the next few days. Return to the ER for symptoms that change or worsen if unable to schedule an appointment.

## 2019-06-15 ENCOUNTER — Ambulatory Visit: Payer: HRSA Program | Attending: Internal Medicine

## 2019-06-15 DIAGNOSIS — Z20822 Contact with and (suspected) exposure to covid-19: Secondary | ICD-10-CM | POA: Diagnosis present

## 2019-06-16 LAB — NOVEL CORONAVIRUS, NAA: SARS-CoV-2, NAA: NOT DETECTED

## 2019-06-16 LAB — SARS-COV-2, NAA 2 DAY TAT

## 2021-01-05 IMAGING — CR DG ABDOMEN 1V
1 series · 2 of 2 positions shown · non-contrast
Comparison: Abdominal radiograph 02/20/2013

CLINICAL DATA: Constipation. Additional history provided: Patient
reports no bowel movement for the past week and abdominal pain for
the last few days. Pain with urination.

EXAM:
ABDOMEN - 1 VIEW

[Series 1: dg abd 1 view · 0.14mm/px · 2 of 2 slices shown]
[im 1/2]
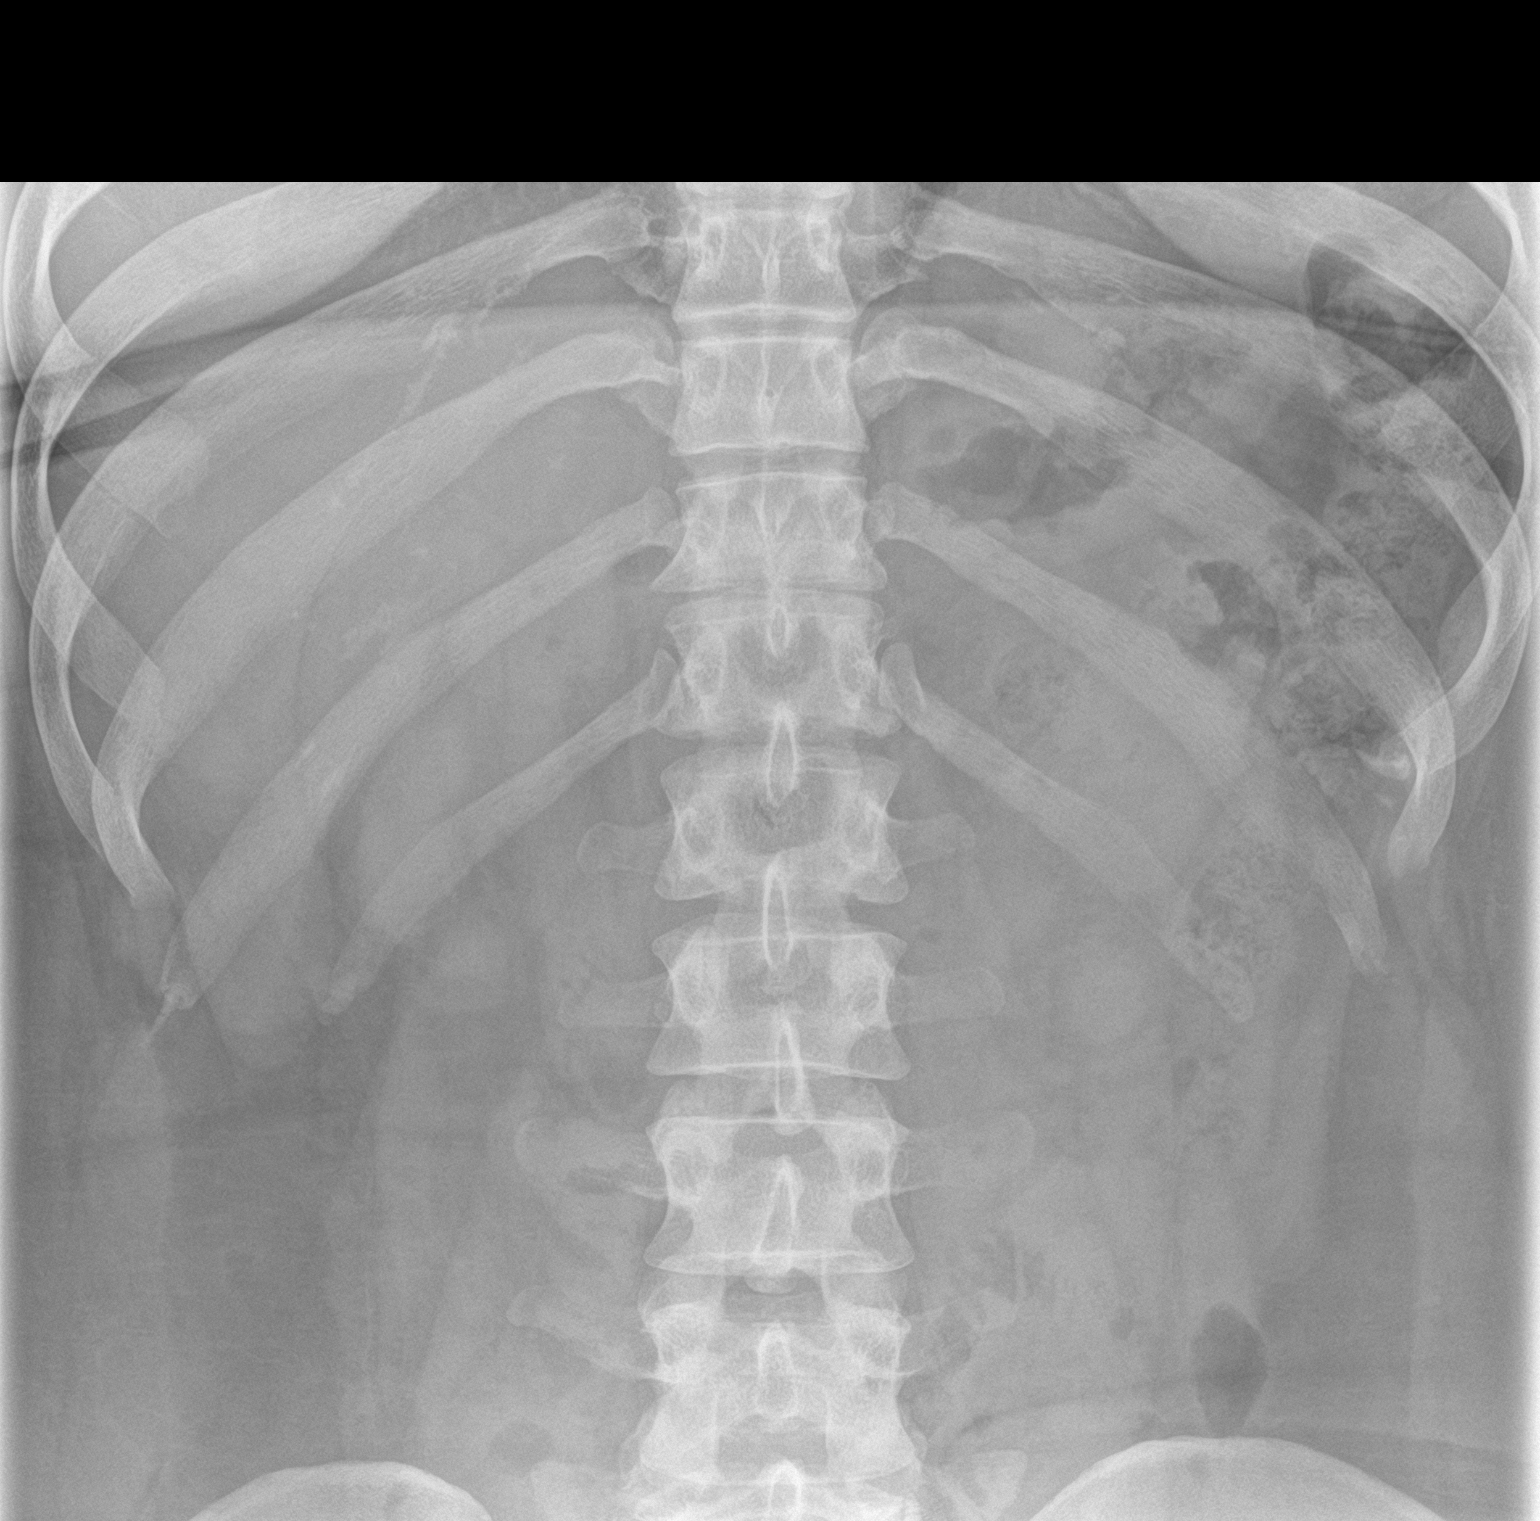
[im 2/2]
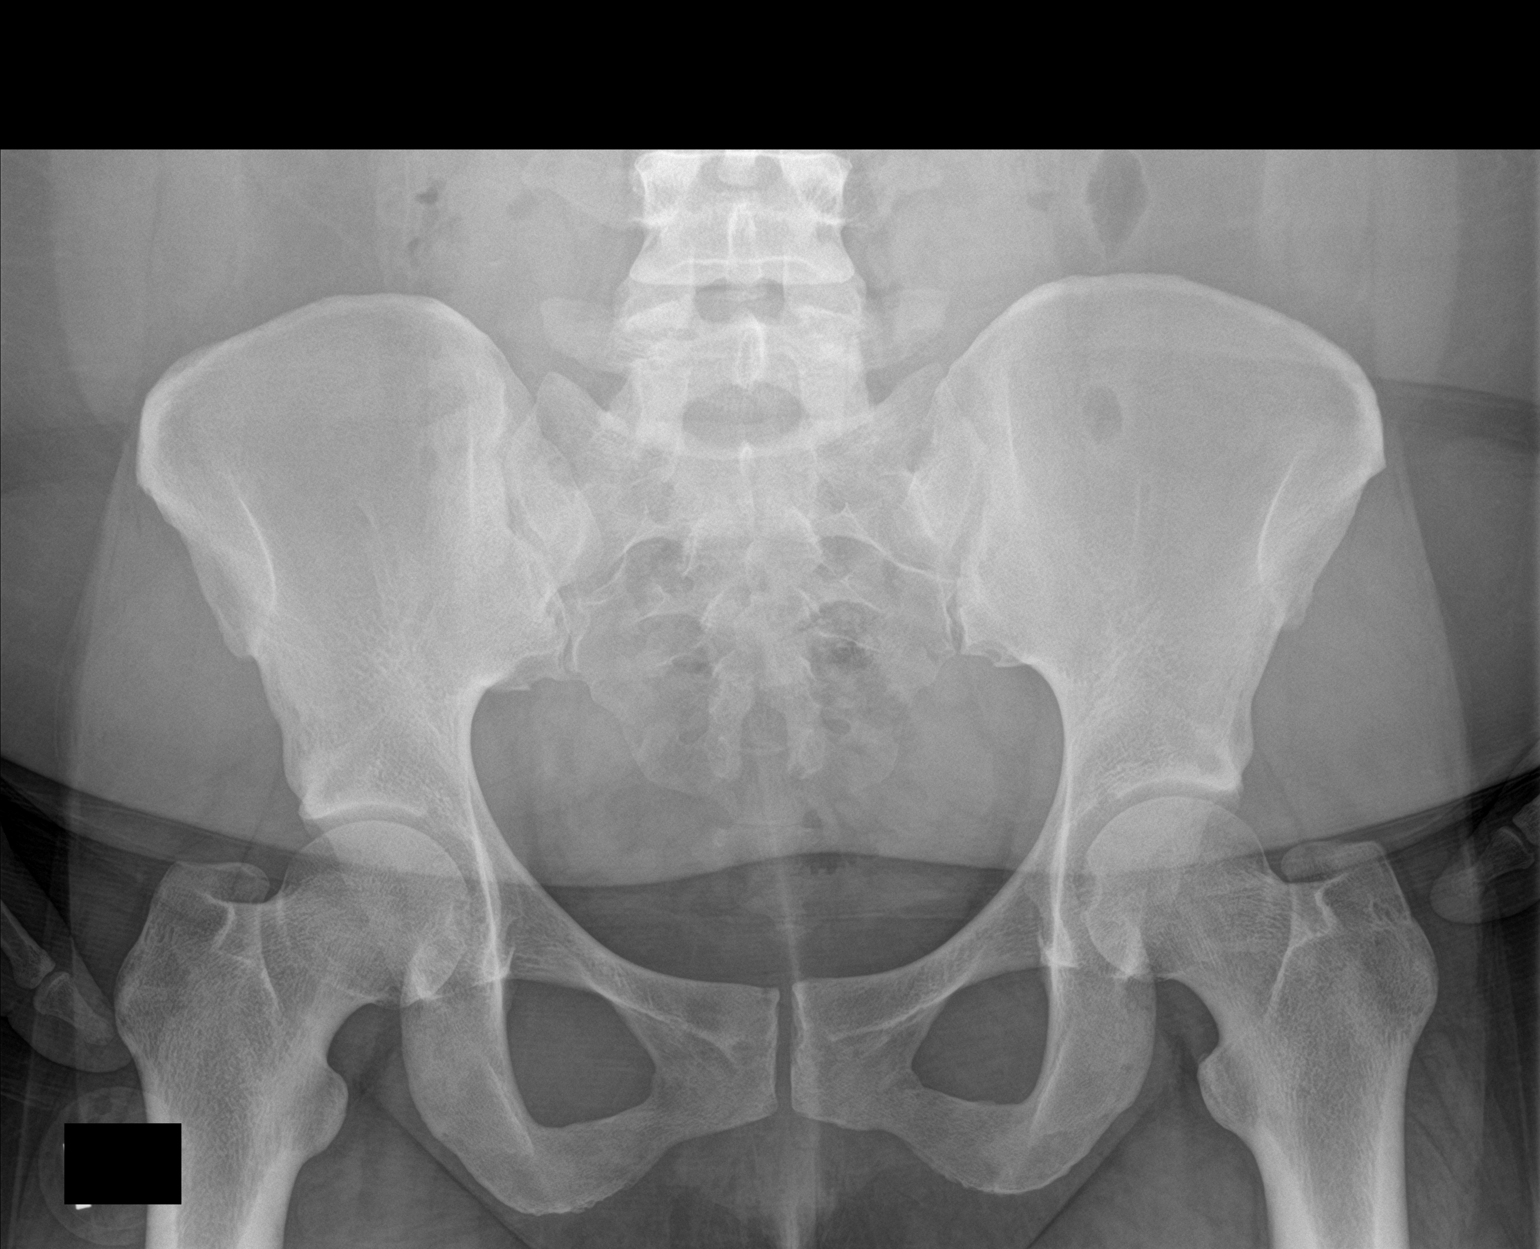

[2 of 2 positions shown; findings below may reference images not displayed]

FINDINGS: No dilated loops of bowel are demonstrated to suggest small bowel
obstruction.

Moderate stool burden within the left colon.

No definite urinary tract calculus is identified.

No acute bony abnormality identified.
IMPRESSION: No radiographic evidence of small-bowel obstruction.

Moderate stool burden within the left colon.
